# Patient Record
Sex: Male | Born: 1945
Health system: Southern US, Community
[De-identification: ages and names within clinical notes are randomized; demographics above are authoritative.]

---

## 2000-03-15 ENCOUNTER — Encounter: Payer: Self-pay | Admitting: Emergency Medicine

## 2000-03-15 ENCOUNTER — Emergency Department (HOSPITAL_COMMUNITY): Admission: EM | Admit: 2000-03-15 | Discharge: 2000-03-15 | Payer: Self-pay | Admitting: Emergency Medicine

## 2000-03-17 ENCOUNTER — Emergency Department (HOSPITAL_COMMUNITY): Admission: EM | Admit: 2000-03-17 | Discharge: 2000-03-17 | Payer: Self-pay | Admitting: Emergency Medicine

## 2001-01-02 ENCOUNTER — Ambulatory Visit (HOSPITAL_COMMUNITY): Admission: RE | Admit: 2001-01-02 | Discharge: 2001-01-02 | Payer: Self-pay | Admitting: Gastroenterology

## 2011-10-03 DIAGNOSIS — Z0000 Encounter for general adult medical examination without abnormal findings: Secondary | ICD-10-CM | POA: Diagnosis not present

## 2011-10-03 DIAGNOSIS — E7800 Pure hypercholesterolemia, unspecified: Secondary | ICD-10-CM | POA: Diagnosis not present

## 2011-10-03 DIAGNOSIS — Z79899 Other long term (current) drug therapy: Secondary | ICD-10-CM | POA: Diagnosis not present

## 2011-10-03 DIAGNOSIS — R079 Chest pain, unspecified: Secondary | ICD-10-CM | POA: Diagnosis not present

## 2011-10-03 DIAGNOSIS — R972 Elevated prostate specific antigen [PSA]: Secondary | ICD-10-CM | POA: Diagnosis not present

## 2011-10-03 DIAGNOSIS — Z23 Encounter for immunization: Secondary | ICD-10-CM | POA: Diagnosis not present

## 2012-02-18 DIAGNOSIS — Z23 Encounter for immunization: Secondary | ICD-10-CM | POA: Diagnosis not present

## 2012-08-10 DIAGNOSIS — H1045 Other chronic allergic conjunctivitis: Secondary | ICD-10-CM | POA: Diagnosis not present

## 2012-08-10 DIAGNOSIS — H43819 Vitreous degeneration, unspecified eye: Secondary | ICD-10-CM | POA: Diagnosis not present

## 2012-08-10 DIAGNOSIS — H43399 Other vitreous opacities, unspecified eye: Secondary | ICD-10-CM | POA: Diagnosis not present

## 2012-08-10 DIAGNOSIS — H2510 Age-related nuclear cataract, unspecified eye: Secondary | ICD-10-CM | POA: Diagnosis not present

## 2012-09-10 DIAGNOSIS — Z86010 Personal history of colonic polyps: Secondary | ICD-10-CM | POA: Diagnosis not present

## 2012-09-10 DIAGNOSIS — Z09 Encounter for follow-up examination after completed treatment for conditions other than malignant neoplasm: Secondary | ICD-10-CM | POA: Diagnosis not present

## 2012-10-08 DIAGNOSIS — Z125 Encounter for screening for malignant neoplasm of prostate: Secondary | ICD-10-CM | POA: Diagnosis not present

## 2012-10-08 DIAGNOSIS — Z79899 Other long term (current) drug therapy: Secondary | ICD-10-CM | POA: Diagnosis not present

## 2012-10-08 DIAGNOSIS — E7800 Pure hypercholesterolemia, unspecified: Secondary | ICD-10-CM | POA: Diagnosis not present

## 2012-10-08 DIAGNOSIS — Z0000 Encounter for general adult medical examination without abnormal findings: Secondary | ICD-10-CM | POA: Diagnosis not present

## 2012-11-06 DIAGNOSIS — Z808 Family history of malignant neoplasm of other organs or systems: Secondary | ICD-10-CM | POA: Diagnosis not present

## 2012-11-06 DIAGNOSIS — L821 Other seborrheic keratosis: Secondary | ICD-10-CM | POA: Diagnosis not present

## 2012-11-06 DIAGNOSIS — L609 Nail disorder, unspecified: Secondary | ICD-10-CM | POA: Diagnosis not present

## 2012-11-06 DIAGNOSIS — L819 Disorder of pigmentation, unspecified: Secondary | ICD-10-CM | POA: Diagnosis not present

## 2012-11-06 DIAGNOSIS — D1801 Hemangioma of skin and subcutaneous tissue: Secondary | ICD-10-CM | POA: Diagnosis not present

## 2012-11-06 DIAGNOSIS — L570 Actinic keratosis: Secondary | ICD-10-CM | POA: Diagnosis not present

## 2012-11-06 DIAGNOSIS — D239 Other benign neoplasm of skin, unspecified: Secondary | ICD-10-CM | POA: Diagnosis not present

## 2013-09-09 DIAGNOSIS — M79609 Pain in unspecified limb: Secondary | ICD-10-CM | POA: Diagnosis not present

## 2013-10-24 DIAGNOSIS — Z125 Encounter for screening for malignant neoplasm of prostate: Secondary | ICD-10-CM | POA: Diagnosis not present

## 2013-10-24 DIAGNOSIS — Z79899 Other long term (current) drug therapy: Secondary | ICD-10-CM | POA: Diagnosis not present

## 2013-10-24 DIAGNOSIS — Z23 Encounter for immunization: Secondary | ICD-10-CM | POA: Diagnosis not present

## 2013-10-24 DIAGNOSIS — Z0000 Encounter for general adult medical examination without abnormal findings: Secondary | ICD-10-CM | POA: Diagnosis not present

## 2013-10-24 DIAGNOSIS — E7800 Pure hypercholesterolemia, unspecified: Secondary | ICD-10-CM | POA: Diagnosis not present

## 2013-11-07 DIAGNOSIS — Z808 Family history of malignant neoplasm of other organs or systems: Secondary | ICD-10-CM | POA: Diagnosis not present

## 2013-11-07 DIAGNOSIS — L819 Disorder of pigmentation, unspecified: Secondary | ICD-10-CM | POA: Diagnosis not present

## 2013-11-07 DIAGNOSIS — L821 Other seborrheic keratosis: Secondary | ICD-10-CM | POA: Diagnosis not present

## 2013-11-07 DIAGNOSIS — D1801 Hemangioma of skin and subcutaneous tissue: Secondary | ICD-10-CM | POA: Diagnosis not present

## 2013-11-07 DIAGNOSIS — D239 Other benign neoplasm of skin, unspecified: Secondary | ICD-10-CM | POA: Diagnosis not present

## 2014-03-24 DIAGNOSIS — Z23 Encounter for immunization: Secondary | ICD-10-CM | POA: Diagnosis not present

## 2014-11-12 DIAGNOSIS — L821 Other seborrheic keratosis: Secondary | ICD-10-CM | POA: Diagnosis not present

## 2014-11-12 DIAGNOSIS — D225 Melanocytic nevi of trunk: Secondary | ICD-10-CM | POA: Diagnosis not present

## 2014-11-12 DIAGNOSIS — B351 Tinea unguium: Secondary | ICD-10-CM | POA: Diagnosis not present

## 2014-11-12 DIAGNOSIS — L814 Other melanin hyperpigmentation: Secondary | ICD-10-CM | POA: Diagnosis not present

## 2014-11-12 DIAGNOSIS — D1800 Hemangioma unspecified site: Secondary | ICD-10-CM | POA: Diagnosis not present

## 2014-11-13 DIAGNOSIS — E780 Pure hypercholesterolemia: Secondary | ICD-10-CM | POA: Diagnosis not present

## 2014-11-13 DIAGNOSIS — Z0000 Encounter for general adult medical examination without abnormal findings: Secondary | ICD-10-CM | POA: Diagnosis not present

## 2014-11-13 DIAGNOSIS — Z79899 Other long term (current) drug therapy: Secondary | ICD-10-CM | POA: Diagnosis not present

## 2014-11-13 DIAGNOSIS — Z125 Encounter for screening for malignant neoplasm of prostate: Secondary | ICD-10-CM | POA: Diagnosis not present

## 2015-03-09 DIAGNOSIS — Z23 Encounter for immunization: Secondary | ICD-10-CM | POA: Diagnosis not present

## 2015-11-18 DIAGNOSIS — D1800 Hemangioma unspecified site: Secondary | ICD-10-CM | POA: Diagnosis not present

## 2015-11-18 DIAGNOSIS — L603 Nail dystrophy: Secondary | ICD-10-CM | POA: Diagnosis not present

## 2015-11-18 DIAGNOSIS — D225 Melanocytic nevi of trunk: Secondary | ICD-10-CM | POA: Diagnosis not present

## 2015-11-18 DIAGNOSIS — L821 Other seborrheic keratosis: Secondary | ICD-10-CM | POA: Diagnosis not present

## 2015-11-18 DIAGNOSIS — L814 Other melanin hyperpigmentation: Secondary | ICD-10-CM | POA: Diagnosis not present

## 2015-11-18 DIAGNOSIS — L570 Actinic keratosis: Secondary | ICD-10-CM | POA: Diagnosis not present

## 2015-11-20 DIAGNOSIS — B351 Tinea unguium: Secondary | ICD-10-CM | POA: Diagnosis not present

## 2015-11-20 DIAGNOSIS — Z0000 Encounter for general adult medical examination without abnormal findings: Secondary | ICD-10-CM | POA: Diagnosis not present

## 2015-11-20 DIAGNOSIS — Z79899 Other long term (current) drug therapy: Secondary | ICD-10-CM | POA: Diagnosis not present

## 2015-11-20 DIAGNOSIS — Z125 Encounter for screening for malignant neoplasm of prostate: Secondary | ICD-10-CM | POA: Diagnosis not present

## 2015-11-20 DIAGNOSIS — E7800 Pure hypercholesterolemia, unspecified: Secondary | ICD-10-CM | POA: Diagnosis not present

## 2016-02-01 DIAGNOSIS — Z23 Encounter for immunization: Secondary | ICD-10-CM | POA: Diagnosis not present

## 2016-02-08 DIAGNOSIS — Z0000 Encounter for general adult medical examination without abnormal findings: Secondary | ICD-10-CM | POA: Diagnosis not present

## 2016-02-08 DIAGNOSIS — E7800 Pure hypercholesterolemia, unspecified: Secondary | ICD-10-CM | POA: Diagnosis not present

## 2016-02-08 DIAGNOSIS — Z125 Encounter for screening for malignant neoplasm of prostate: Secondary | ICD-10-CM | POA: Diagnosis not present

## 2016-02-08 DIAGNOSIS — Z79899 Other long term (current) drug therapy: Secondary | ICD-10-CM | POA: Diagnosis not present

## 2016-02-08 DIAGNOSIS — B351 Tinea unguium: Secondary | ICD-10-CM | POA: Diagnosis not present

## 2016-06-10 DIAGNOSIS — H2513 Age-related nuclear cataract, bilateral: Secondary | ICD-10-CM | POA: Diagnosis not present

## 2016-06-10 DIAGNOSIS — H35363 Drusen (degenerative) of macula, bilateral: Secondary | ICD-10-CM | POA: Diagnosis not present

## 2016-06-10 DIAGNOSIS — H25013 Cortical age-related cataract, bilateral: Secondary | ICD-10-CM | POA: Diagnosis not present

## 2016-06-10 DIAGNOSIS — H35033 Hypertensive retinopathy, bilateral: Secondary | ICD-10-CM | POA: Diagnosis not present

## 2016-06-10 DIAGNOSIS — H43813 Vitreous degeneration, bilateral: Secondary | ICD-10-CM | POA: Diagnosis not present

## 2016-12-21 DIAGNOSIS — Z79899 Other long term (current) drug therapy: Secondary | ICD-10-CM | POA: Diagnosis not present

## 2016-12-21 DIAGNOSIS — B351 Tinea unguium: Secondary | ICD-10-CM | POA: Diagnosis not present

## 2016-12-21 DIAGNOSIS — E7800 Pure hypercholesterolemia, unspecified: Secondary | ICD-10-CM | POA: Diagnosis not present

## 2016-12-21 DIAGNOSIS — Z125 Encounter for screening for malignant neoplasm of prostate: Secondary | ICD-10-CM | POA: Diagnosis not present

## 2016-12-21 DIAGNOSIS — M5430 Sciatica, unspecified side: Secondary | ICD-10-CM | POA: Diagnosis not present

## 2016-12-21 DIAGNOSIS — Z0000 Encounter for general adult medical examination without abnormal findings: Secondary | ICD-10-CM | POA: Diagnosis not present

## 2017-01-02 DIAGNOSIS — M5430 Sciatica, unspecified side: Secondary | ICD-10-CM | POA: Diagnosis not present

## 2017-01-05 DIAGNOSIS — M5430 Sciatica, unspecified side: Secondary | ICD-10-CM | POA: Diagnosis not present

## 2017-01-09 DIAGNOSIS — M5430 Sciatica, unspecified side: Secondary | ICD-10-CM | POA: Diagnosis not present

## 2017-01-11 DIAGNOSIS — M5430 Sciatica, unspecified side: Secondary | ICD-10-CM | POA: Diagnosis not present

## 2017-01-16 DIAGNOSIS — M5430 Sciatica, unspecified side: Secondary | ICD-10-CM | POA: Diagnosis not present

## 2017-02-15 DIAGNOSIS — Z23 Encounter for immunization: Secondary | ICD-10-CM | POA: Diagnosis not present

## 2017-06-12 DIAGNOSIS — H25013 Cortical age-related cataract, bilateral: Secondary | ICD-10-CM | POA: Diagnosis not present

## 2017-06-12 DIAGNOSIS — H35033 Hypertensive retinopathy, bilateral: Secondary | ICD-10-CM | POA: Diagnosis not present

## 2017-06-12 DIAGNOSIS — H43813 Vitreous degeneration, bilateral: Secondary | ICD-10-CM | POA: Diagnosis not present

## 2017-06-12 DIAGNOSIS — H2513 Age-related nuclear cataract, bilateral: Secondary | ICD-10-CM | POA: Diagnosis not present

## 2017-07-19 DIAGNOSIS — D225 Melanocytic nevi of trunk: Secondary | ICD-10-CM | POA: Diagnosis not present

## 2017-07-19 DIAGNOSIS — L603 Nail dystrophy: Secondary | ICD-10-CM | POA: Diagnosis not present

## 2017-07-19 DIAGNOSIS — D1800 Hemangioma unspecified site: Secondary | ICD-10-CM | POA: Diagnosis not present

## 2017-07-19 DIAGNOSIS — L814 Other melanin hyperpigmentation: Secondary | ICD-10-CM | POA: Diagnosis not present

## 2017-07-19 DIAGNOSIS — L570 Actinic keratosis: Secondary | ICD-10-CM | POA: Diagnosis not present

## 2017-07-19 DIAGNOSIS — L821 Other seborrheic keratosis: Secondary | ICD-10-CM | POA: Diagnosis not present

## 2017-07-19 DIAGNOSIS — Z23 Encounter for immunization: Secondary | ICD-10-CM | POA: Diagnosis not present

## 2018-01-08 DIAGNOSIS — E7800 Pure hypercholesterolemia, unspecified: Secondary | ICD-10-CM | POA: Diagnosis not present

## 2018-01-08 DIAGNOSIS — Z79899 Other long term (current) drug therapy: Secondary | ICD-10-CM | POA: Diagnosis not present

## 2018-01-08 DIAGNOSIS — Z125 Encounter for screening for malignant neoplasm of prostate: Secondary | ICD-10-CM | POA: Diagnosis not present

## 2018-01-12 DIAGNOSIS — Z79899 Other long term (current) drug therapy: Secondary | ICD-10-CM | POA: Diagnosis not present

## 2018-01-12 DIAGNOSIS — Z23 Encounter for immunization: Secondary | ICD-10-CM | POA: Diagnosis not present

## 2018-01-12 DIAGNOSIS — M25559 Pain in unspecified hip: Secondary | ICD-10-CM | POA: Diagnosis not present

## 2018-01-12 DIAGNOSIS — Z0000 Encounter for general adult medical examination without abnormal findings: Secondary | ICD-10-CM | POA: Diagnosis not present

## 2018-01-12 DIAGNOSIS — Z1389 Encounter for screening for other disorder: Secondary | ICD-10-CM | POA: Diagnosis not present

## 2018-01-12 DIAGNOSIS — E7800 Pure hypercholesterolemia, unspecified: Secondary | ICD-10-CM | POA: Diagnosis not present

## 2018-03-08 DIAGNOSIS — H25013 Cortical age-related cataract, bilateral: Secondary | ICD-10-CM | POA: Diagnosis not present

## 2018-03-08 DIAGNOSIS — H2511 Age-related nuclear cataract, right eye: Secondary | ICD-10-CM | POA: Diagnosis not present

## 2018-03-08 DIAGNOSIS — H25011 Cortical age-related cataract, right eye: Secondary | ICD-10-CM | POA: Diagnosis not present

## 2018-03-08 DIAGNOSIS — H35033 Hypertensive retinopathy, bilateral: Secondary | ICD-10-CM | POA: Diagnosis not present

## 2018-03-08 DIAGNOSIS — H2513 Age-related nuclear cataract, bilateral: Secondary | ICD-10-CM | POA: Diagnosis not present

## 2018-03-08 DIAGNOSIS — H43813 Vitreous degeneration, bilateral: Secondary | ICD-10-CM | POA: Diagnosis not present

## 2018-03-28 DIAGNOSIS — H25811 Combined forms of age-related cataract, right eye: Secondary | ICD-10-CM | POA: Diagnosis not present

## 2018-03-28 DIAGNOSIS — H2511 Age-related nuclear cataract, right eye: Secondary | ICD-10-CM | POA: Diagnosis not present

## 2018-10-19 DIAGNOSIS — H04213 Epiphora due to excess lacrimation, bilateral lacrimal glands: Secondary | ICD-10-CM | POA: Diagnosis not present

## 2018-10-19 DIAGNOSIS — Z961 Presence of intraocular lens: Secondary | ICD-10-CM | POA: Diagnosis not present

## 2018-10-19 DIAGNOSIS — H35033 Hypertensive retinopathy, bilateral: Secondary | ICD-10-CM | POA: Diagnosis not present

## 2018-10-19 DIAGNOSIS — H2512 Age-related nuclear cataract, left eye: Secondary | ICD-10-CM | POA: Diagnosis not present

## 2019-01-18 DIAGNOSIS — Z23 Encounter for immunization: Secondary | ICD-10-CM | POA: Diagnosis not present

## 2019-01-23 DIAGNOSIS — D225 Melanocytic nevi of trunk: Secondary | ICD-10-CM | POA: Diagnosis not present

## 2019-01-23 DIAGNOSIS — L570 Actinic keratosis: Secondary | ICD-10-CM | POA: Diagnosis not present

## 2019-01-23 DIAGNOSIS — L821 Other seborrheic keratosis: Secondary | ICD-10-CM | POA: Diagnosis not present

## 2019-01-23 DIAGNOSIS — L814 Other melanin hyperpigmentation: Secondary | ICD-10-CM | POA: Diagnosis not present

## 2019-02-05 DIAGNOSIS — Z125 Encounter for screening for malignant neoplasm of prostate: Secondary | ICD-10-CM | POA: Diagnosis not present

## 2019-02-05 DIAGNOSIS — B351 Tinea unguium: Secondary | ICD-10-CM | POA: Diagnosis not present

## 2019-02-05 DIAGNOSIS — M2550 Pain in unspecified joint: Secondary | ICD-10-CM | POA: Diagnosis not present

## 2019-02-05 DIAGNOSIS — Z79899 Other long term (current) drug therapy: Secondary | ICD-10-CM | POA: Diagnosis not present

## 2019-02-05 DIAGNOSIS — E7800 Pure hypercholesterolemia, unspecified: Secondary | ICD-10-CM | POA: Diagnosis not present

## 2019-02-05 DIAGNOSIS — Z0000 Encounter for general adult medical examination without abnormal findings: Secondary | ICD-10-CM | POA: Diagnosis not present

## 2019-06-06 ENCOUNTER — Ambulatory Visit: Payer: Self-pay

## 2019-12-10 DIAGNOSIS — H26491 Other secondary cataract, right eye: Secondary | ICD-10-CM | POA: Diagnosis not present

## 2020-01-21 ENCOUNTER — Emergency Department (HOSPITAL_BASED_OUTPATIENT_CLINIC_OR_DEPARTMENT_OTHER)
Admission: EM | Admit: 2020-01-21 | Discharge: 2020-01-21 | Disposition: A | Discharge status: Home / Self Care | Payer: Medicare Other | Attending: Emergency Medicine | Admitting: Emergency Medicine

## 2020-01-21 ENCOUNTER — Other Ambulatory Visit: Payer: Self-pay

## 2020-01-21 ENCOUNTER — Encounter (HOSPITAL_BASED_OUTPATIENT_CLINIC_OR_DEPARTMENT_OTHER): Payer: Self-pay | Admitting: Emergency Medicine

## 2020-01-21 ENCOUNTER — Emergency Department (HOSPITAL_BASED_OUTPATIENT_CLINIC_OR_DEPARTMENT_OTHER): Payer: Medicare Other

## 2020-01-21 DIAGNOSIS — L570 Actinic keratosis: Secondary | ICD-10-CM | POA: Diagnosis not present

## 2020-01-21 DIAGNOSIS — L578 Other skin changes due to chronic exposure to nonionizing radiation: Secondary | ICD-10-CM | POA: Diagnosis not present

## 2020-01-21 DIAGNOSIS — S61012A Laceration without foreign body of left thumb without damage to nail, initial encounter: Secondary | ICD-10-CM | POA: Diagnosis not present

## 2020-01-21 DIAGNOSIS — S60932A Unspecified superficial injury of left thumb, initial encounter: Secondary | ICD-10-CM | POA: Diagnosis present

## 2020-01-21 DIAGNOSIS — L814 Other melanin hyperpigmentation: Secondary | ICD-10-CM | POA: Diagnosis not present

## 2020-01-21 DIAGNOSIS — W268XXA Contact with other sharp object(s), not elsewhere classified, initial encounter: Secondary | ICD-10-CM | POA: Insufficient documentation

## 2020-01-21 DIAGNOSIS — D225 Melanocytic nevi of trunk: Secondary | ICD-10-CM | POA: Diagnosis not present

## 2020-01-21 DIAGNOSIS — L821 Other seborrheic keratosis: Secondary | ICD-10-CM | POA: Diagnosis not present

## 2020-01-21 MED ORDER — CEPHALEXIN 500 MG PO CAPS: 500.0000 mg | ORAL_CAPSULE | Freq: Two times a day (BID) | ORAL | 0 refills | Status: AC

## 2020-01-21 NOTE — ED Provider Notes (Signed)
[facility] Provider Note   CSN: [id number] Arrival date & time: 01/21/20  1749     History Chief Complaint  Patient presents with  . Laceration    Clifford Mccarty is a 74 y.o. male with no significant past medical history who presents for evaluation of laceration.  Patient was using the table saw 1 hour PTA when he suffered a left thumb laceration.  Nail bed intact without any nailbed damage.  His tetanus is up-to-date.  No active bleeding or drainage.  Rates his pain a 6/10.  Denies additional aggravating or alleviating factors. Denies fever, chills, nausea, vomiting, paresthesias, decreased range of motion, redness, swelling, warmth.  History obtained from patient and past medical history. No interpreter used.  HPI     History reviewed. No pertinent past medical history.  There are no problems to display for this patient.   History reviewed. No pertinent surgical history.     History reviewed. No pertinent family history.  Social History   Tobacco Use  . Smoking status: Never Smoker  Substance Use Topics  . Alcohol use: Not Currently  . Drug use: Never    Home Medications Prior to Admission medications   Medication Sig Start Date End Date Taking? Authorizing Provider  cephALEXin (KEFLEX) 500 MG capsule Take 1 capsule (500 mg total) by mouth 2 (two) times daily for 7 days. 01/21/20 01/28/20  Aubry Rankin A, PA-C    Allergies    Patient has no allergy information on record.  Review of Systems   Review of Systems  Constitutional: Negative.   HENT: Negative.   Respiratory: Negative.   Cardiovascular: Negative.   Gastrointestinal: Negative.   Genitourinary: Negative.   Musculoskeletal: Negative.   Skin: Positive for wound.  Neurological: Negative.   All other systems reviewed and are negative.   Physical Exam Updated Vital Signs BP (!) 147/74 (BP Location: Right Arm)   Pulse (!) 56   Temp 99.5 F (37.5 C) (Oral)    Resp 20   Ht 5\' 7"  (1.702 m)   Wt 73.5 kg   SpO2 99%   BMI 25.37 kg/m   Physical Exam Vitals and nursing note reviewed.  Constitutional:      General: He is not in acute distress.    Appearance: He is well-developed. He is not ill-appearing, toxic-appearing or diaphoretic.  HENT:     Head: Normocephalic and atraumatic.     Nose: Nose normal.     Mouth/Throat:     Mouth: Mucous membranes are moist.  Eyes:     Pupils: Pupils are equal, round, and reactive to light.  Cardiovascular:     Rate and Rhythm: Normal rate and regular rhythm.     Pulses: Normal pulses.     Heart sounds: Normal heart sounds.  Pulmonary:     Effort: Pulmonary effort is normal. No respiratory distress.     Breath sounds: Normal breath sounds.  Abdominal:     General: Bowel sounds are normal. There is no distension.     Palpations: Abdomen is soft.  Musculoskeletal:        General: Signs of injury present. No swelling, tenderness or deformity. Normal range of motion.       Hands:     Cervical back: Normal range of motion and neck supple.     Right lower leg: No edema.     Left lower leg: No edema.     Comments: No bony tenderness.  Moves all 4 extremities  without difficulty.  Compartments soft  Skin:    General: Skin is warm and dry.     Capillary Refill: Capillary refill takes less than 2 seconds.     Findings: Signs of injury and wound present.     Comments: Soft tissue dermal avulsion to plantar aspect of left thumb approximately 1 cm and rounded.  Nailbed is intact.  No evidence of subungual hematoma.  No lacerations to suture.  No active bleeding or drainage.  See picture in chart  Neurological:     General: No focal deficit present.     Mental Status: He is alert.     Sensory: Sensation is intact.     Motor: Motor function is intact.     Comments: Intact sensation 5/5 strength to bilateral lower extremities without difficulty       ED Results / Procedures / Treatments   Labs (all labs  ordered are listed, but only abnormal results are displayed) Labs Reviewed - No data to display  EKG None  Radiology DG Finger Thumb Left  Result Date: 01/21/2020 CLINICAL DATA:  Laceration.  Bleeding controlled. EXAM: LEFT THUMB 2+V COMPARISON:  None. FINDINGS: Soft tissue injury about the tuft of the thumb. No acute fracture or dislocation. No radiopaque foreign object. IMPRESSION: Soft tissue injury, without acute osseous abnormality. Electronically Signed   By: Abigail Miyamoto M.D.   On: 01/21/2020 19:24    Procedures Wound repair  Date/Time: 01/21/2020 8:18 PM Performed by: Nettie Elm, PA-C Authorized by: Nettie Elm, PA-C  Preparation: Patient was prepped and draped in the usual sterile fashion. Local anesthesia used: no  Anesthesia: Local anesthesia used: no  Sedation: Patient sedated: no  Patient tolerance: patient tolerated the procedure well with no immediate complications    (including critical care time)  Medications Ordered in ED Medications - No data to display  ED Course  I have reviewed the triage vital signs and the nursing notes.  Pertinent labs & imaging results that were available during my care of the patient were reviewed by me and considered in my medical decision making (see chart for details).  74 year old male presents for evaluation of hand injury which occurred 1 hour PTA.  His tetanus is up-to-date.  He is neurovascularly intact.  He has neuromusculoskeletal exam.  Patient with dermal avulsion approximate 1 cm, rounded to distal left thumb on palmar aspect.  No nailbed involvement.  No lacerations to suture.  No active bleeding or drainage.  X-ray here in the emergency department does not show any bony abnormality.  Wound was covered, bacitracin ointment applied. Will dc home with hand surgery follow up. Patients wife has Copy and they will follow up there. No active infection currently.  The patient has been appropriately  medically screened and/or stabilized in the ED. I have low suspicion for any other emergent medical condition which would require further screening, evaluation or treatment in the ED or require inpatient management.  Patient is hemodynamically stable and in no acute distress.  Patient able to ambulate in department prior to ED.  Evaluation does not show acute pathology that would require ongoing or additional emergent interventions while in the emergency department or further inpatient treatment.  I have discussed the diagnosis with the patient and answered all questions.  Pain is been managed while in the emergency department and patient has no further complaints prior to discharge.  Patient is comfortable with plan discussed in room and is stable for discharge at this time.  I have discussed strict return precautions for returning to the emergency department.  Patient was encouraged to follow-up with PCP/specialist refer to at discharge.    MDM Rules/Calculators/A&P                           Final Clinical Impression(s) / ED Diagnoses Final diagnoses:  Laceration of left thumb without foreign body without damage to nail, initial encounter    Rx / DC Orders ED Discharge Orders         Ordered    cephALEXin (KEFLEX) 500 MG capsule  2 times daily        01/21/20 2019           Annahi Short A, PA-C 01/21/20 2024    Hayden Rasmussen, MD 01/22/20 [phone]

## 2020-01-21 NOTE — ED Triage Notes (Signed)
Pt arrives POV with c/o left thumb laceration on table saw 1 hr pta. Bandage applied in triage, bleeding controlled

## 2020-01-21 NOTE — Discharge Instructions (Addendum)
Let warm soapy water run over the wound.  If the wound begins to bleed hold pressure.  Take the antibiotics as prescribed  Return for any worsening symptoms

## 2020-01-21 NOTE — ED Notes (Signed)
Non-adherent bandage applied and wound dressed per provider order.

## 2020-01-22 DIAGNOSIS — S61002D Unspecified open wound of left thumb without damage to nail, subsequent encounter: Secondary | ICD-10-CM | POA: Diagnosis not present

## 2020-01-22 DIAGNOSIS — S68119D Complete traumatic metacarpophalangeal amputation of unspecified finger, subsequent encounter: Secondary | ICD-10-CM | POA: Diagnosis not present

## 2020-01-22 DIAGNOSIS — M79645 Pain in left finger(s): Secondary | ICD-10-CM | POA: Diagnosis not present

## 2020-01-22 DIAGNOSIS — S68119A Complete traumatic metacarpophalangeal amputation of unspecified finger, initial encounter: Secondary | ICD-10-CM | POA: Diagnosis not present

## 2020-01-29 DIAGNOSIS — S68119A Complete traumatic metacarpophalangeal amputation of unspecified finger, initial encounter: Secondary | ICD-10-CM | POA: Diagnosis not present

## 2020-01-30 DIAGNOSIS — Z23 Encounter for immunization: Secondary | ICD-10-CM | POA: Diagnosis not present

## 2020-02-18 DIAGNOSIS — E7800 Pure hypercholesterolemia, unspecified: Secondary | ICD-10-CM | POA: Diagnosis not present

## 2020-02-18 DIAGNOSIS — Z125 Encounter for screening for malignant neoplasm of prostate: Secondary | ICD-10-CM | POA: Diagnosis not present

## 2020-02-18 DIAGNOSIS — Z79899 Other long term (current) drug therapy: Secondary | ICD-10-CM | POA: Diagnosis not present

## 2020-02-18 DIAGNOSIS — Z0000 Encounter for general adult medical examination without abnormal findings: Secondary | ICD-10-CM | POA: Diagnosis not present

## 2020-02-18 DIAGNOSIS — Z23 Encounter for immunization: Secondary | ICD-10-CM | POA: Diagnosis not present

## 2020-04-10 DIAGNOSIS — Z86010 Personal history of colonic polyps: Secondary | ICD-10-CM | POA: Diagnosis not present

## 2020-05-12 DIAGNOSIS — L309 Dermatitis, unspecified: Secondary | ICD-10-CM | POA: Diagnosis not present

## 2020-05-18 DIAGNOSIS — E7800 Pure hypercholesterolemia, unspecified: Secondary | ICD-10-CM | POA: Diagnosis not present

## 2020-05-18 DIAGNOSIS — Z125 Encounter for screening for malignant neoplasm of prostate: Secondary | ICD-10-CM | POA: Diagnosis not present

## 2020-05-18 DIAGNOSIS — R972 Elevated prostate specific antigen [PSA]: Secondary | ICD-10-CM | POA: Diagnosis not present

## 2020-05-18 DIAGNOSIS — Z23 Encounter for immunization: Secondary | ICD-10-CM | POA: Diagnosis not present

## 2020-05-18 DIAGNOSIS — Z0000 Encounter for general adult medical examination without abnormal findings: Secondary | ICD-10-CM | POA: Diagnosis not present

## 2020-05-18 DIAGNOSIS — Z79899 Other long term (current) drug therapy: Secondary | ICD-10-CM | POA: Diagnosis not present

## 2020-05-26 DIAGNOSIS — H2512 Age-related nuclear cataract, left eye: Secondary | ICD-10-CM | POA: Diagnosis not present

## 2020-05-26 DIAGNOSIS — H35033 Hypertensive retinopathy, bilateral: Secondary | ICD-10-CM | POA: Diagnosis not present

## 2020-05-26 DIAGNOSIS — H35363 Drusen (degenerative) of macula, bilateral: Secondary | ICD-10-CM | POA: Diagnosis not present

## 2020-05-26 DIAGNOSIS — Z961 Presence of intraocular lens: Secondary | ICD-10-CM | POA: Diagnosis not present

## 2020-07-01 DIAGNOSIS — R972 Elevated prostate specific antigen [PSA]: Secondary | ICD-10-CM | POA: Diagnosis not present

## 2020-07-01 DIAGNOSIS — N400 Enlarged prostate without lower urinary tract symptoms: Secondary | ICD-10-CM | POA: Diagnosis not present

## 2020-10-24 DIAGNOSIS — Z23 Encounter for immunization: Secondary | ICD-10-CM | POA: Diagnosis not present

## 2021-01-25 DIAGNOSIS — Z23 Encounter for immunization: Secondary | ICD-10-CM | POA: Diagnosis not present

## 2021-01-25 DIAGNOSIS — L570 Actinic keratosis: Secondary | ICD-10-CM | POA: Diagnosis not present

## 2021-01-25 DIAGNOSIS — L578 Other skin changes due to chronic exposure to nonionizing radiation: Secondary | ICD-10-CM | POA: Diagnosis not present

## 2021-01-25 DIAGNOSIS — W57XXXA Bitten or stung by nonvenomous insect and other nonvenomous arthropods, initial encounter: Secondary | ICD-10-CM | POA: Diagnosis not present

## 2021-01-25 DIAGNOSIS — D225 Melanocytic nevi of trunk: Secondary | ICD-10-CM | POA: Diagnosis not present

## 2021-01-25 DIAGNOSIS — S80862A Insect bite (nonvenomous), left lower leg, initial encounter: Secondary | ICD-10-CM | POA: Diagnosis not present

## 2021-01-25 DIAGNOSIS — L821 Other seborrheic keratosis: Secondary | ICD-10-CM | POA: Diagnosis not present

## 2021-01-25 DIAGNOSIS — L814 Other melanin hyperpigmentation: Secondary | ICD-10-CM | POA: Diagnosis not present

## 2021-03-08 DIAGNOSIS — Z23 Encounter for immunization: Secondary | ICD-10-CM | POA: Diagnosis not present

## 2021-03-08 DIAGNOSIS — Z0000 Encounter for general adult medical examination without abnormal findings: Secondary | ICD-10-CM | POA: Diagnosis not present

## 2021-03-08 DIAGNOSIS — Z79899 Other long term (current) drug therapy: Secondary | ICD-10-CM | POA: Diagnosis not present

## 2021-03-08 DIAGNOSIS — E7800 Pure hypercholesterolemia, unspecified: Secondary | ICD-10-CM | POA: Diagnosis not present

## 2021-03-08 DIAGNOSIS — R030 Elevated blood-pressure reading, without diagnosis of hypertension: Secondary | ICD-10-CM | POA: Diagnosis not present

## 2021-03-08 DIAGNOSIS — Z125 Encounter for screening for malignant neoplasm of prostate: Secondary | ICD-10-CM | POA: Diagnosis not present

## 2021-04-05 DIAGNOSIS — U071 COVID-19: Secondary | ICD-10-CM | POA: Diagnosis not present

## 2022-01-12 IMAGING — CR DG FINGER THUMB 2+V*L*
3 series · 3 of 3 positions shown · non-contrast
Comparison: None.

CLINICAL DATA: Laceration.  Bleeding controlled.

EXAM:
LEFT THUMB 2+V

[x finger pa left]
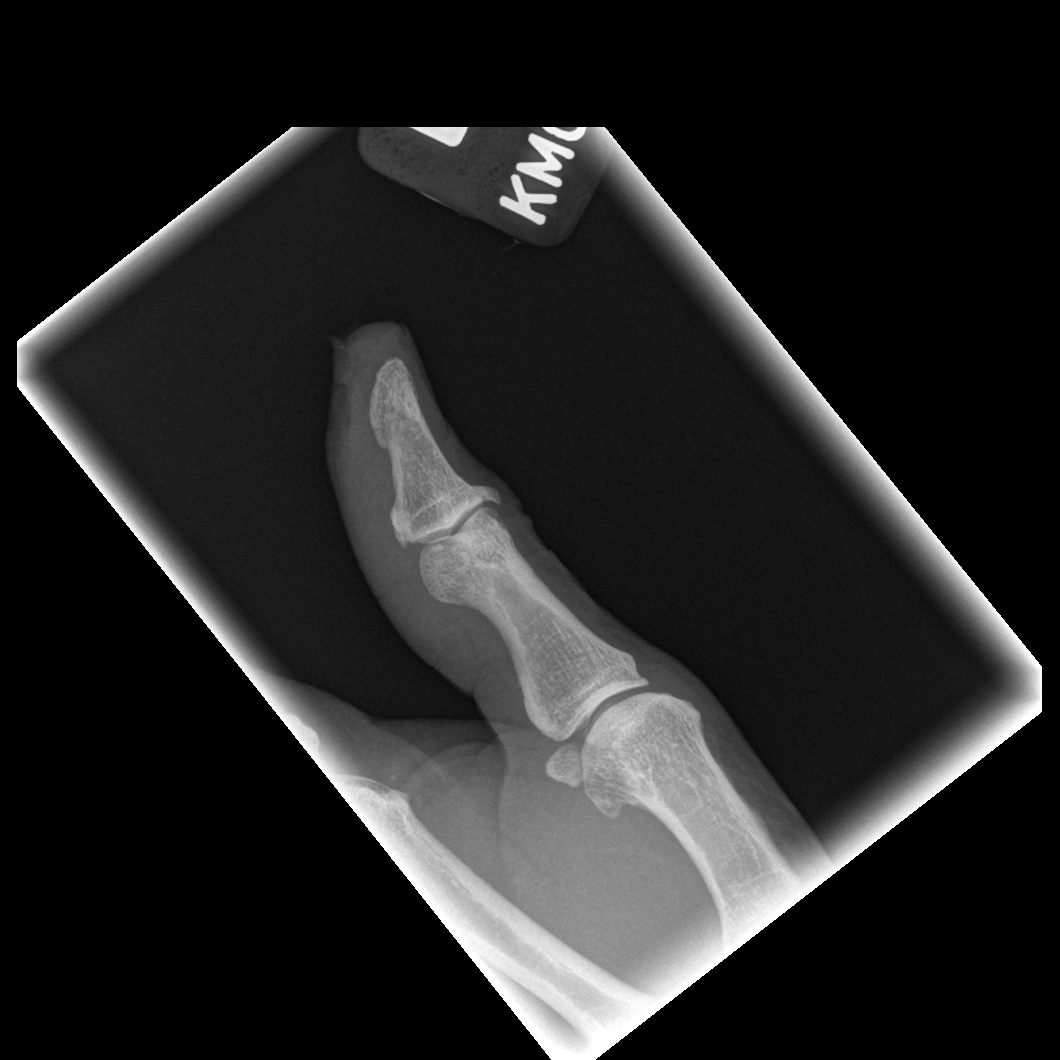

[x finger obl. left]
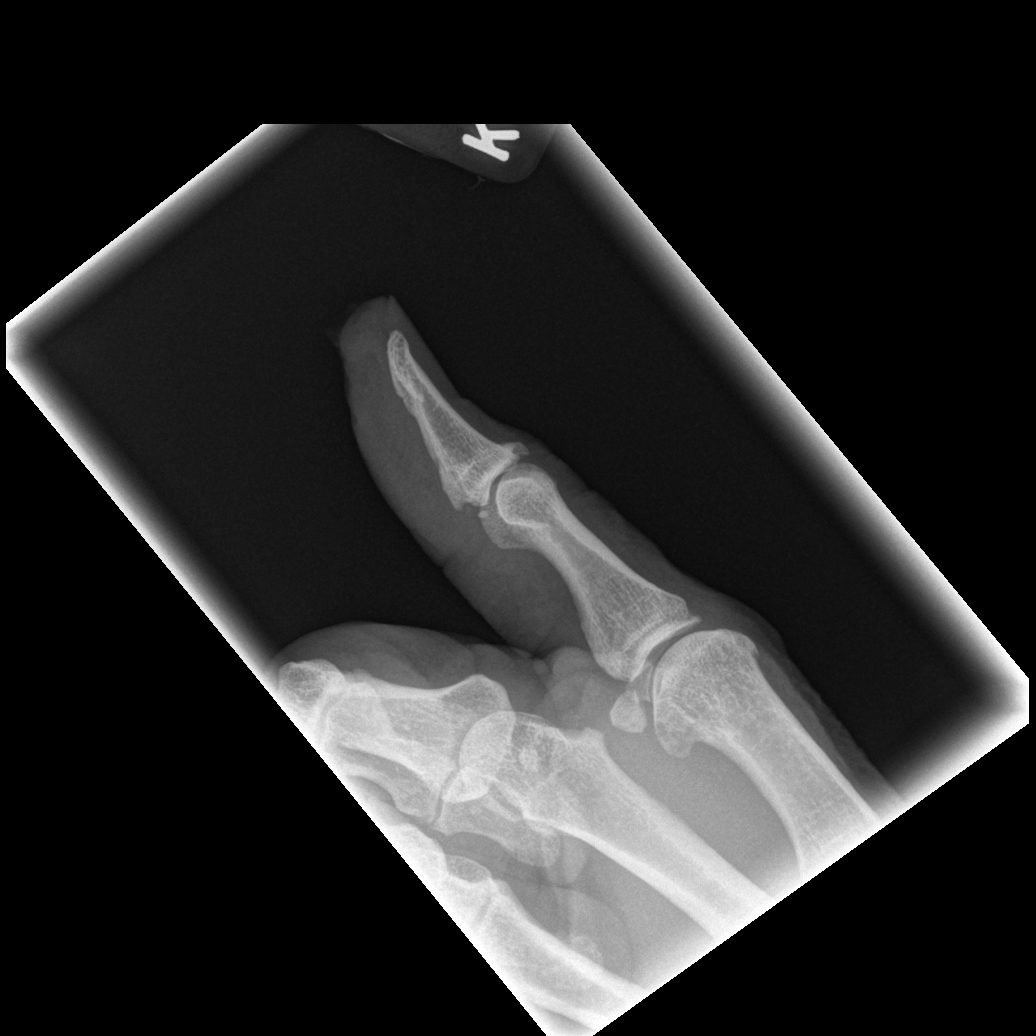

[x finger lateral left]
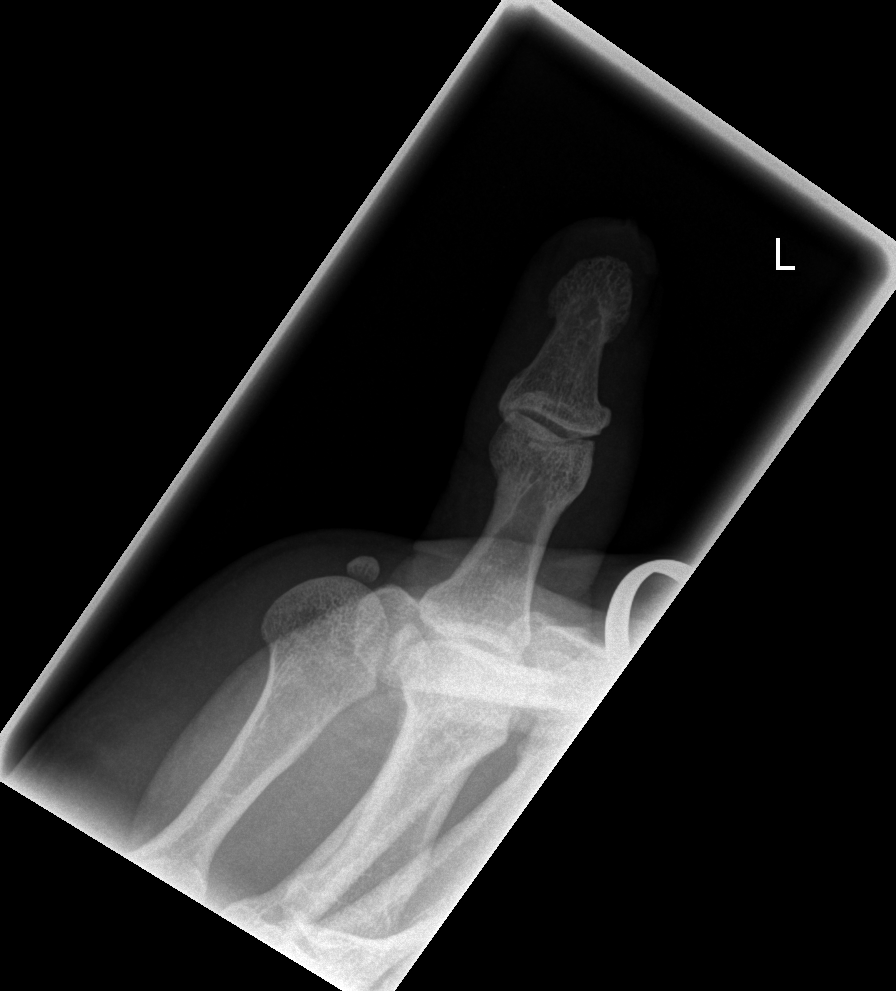

[3 of 3 positions shown; findings below may reference images not displayed]

FINDINGS: Soft tissue injury about the tuft of the thumb. No acute fracture or
dislocation. No radiopaque foreign object.
IMPRESSION: Soft tissue injury, without acute osseous abnormality.
